# Patient Record
Sex: Male | Born: 1975 | Race: Black or African American | Hispanic: No | Marital: Single
Health system: Southern US, Community
[De-identification: ages and names within clinical notes are randomized; demographics above are authoritative.]

---

## 2006-08-16 ENCOUNTER — Emergency Department (HOSPITAL_COMMUNITY): Admission: EM | Admit: 2006-08-16 | Discharge: 2006-08-17 | Payer: Self-pay | Admitting: Emergency Medicine

## 2007-05-04 IMAGING — CR DG SHOULDER 2+V*R*
2 series · 2 of 2 positions shown · non-contrast
Comparison: none

CLINICAL DATA: Injury.
 RIGHT SHOULDER ? 2 VIEW:

[view not recorded (1 of 2)]
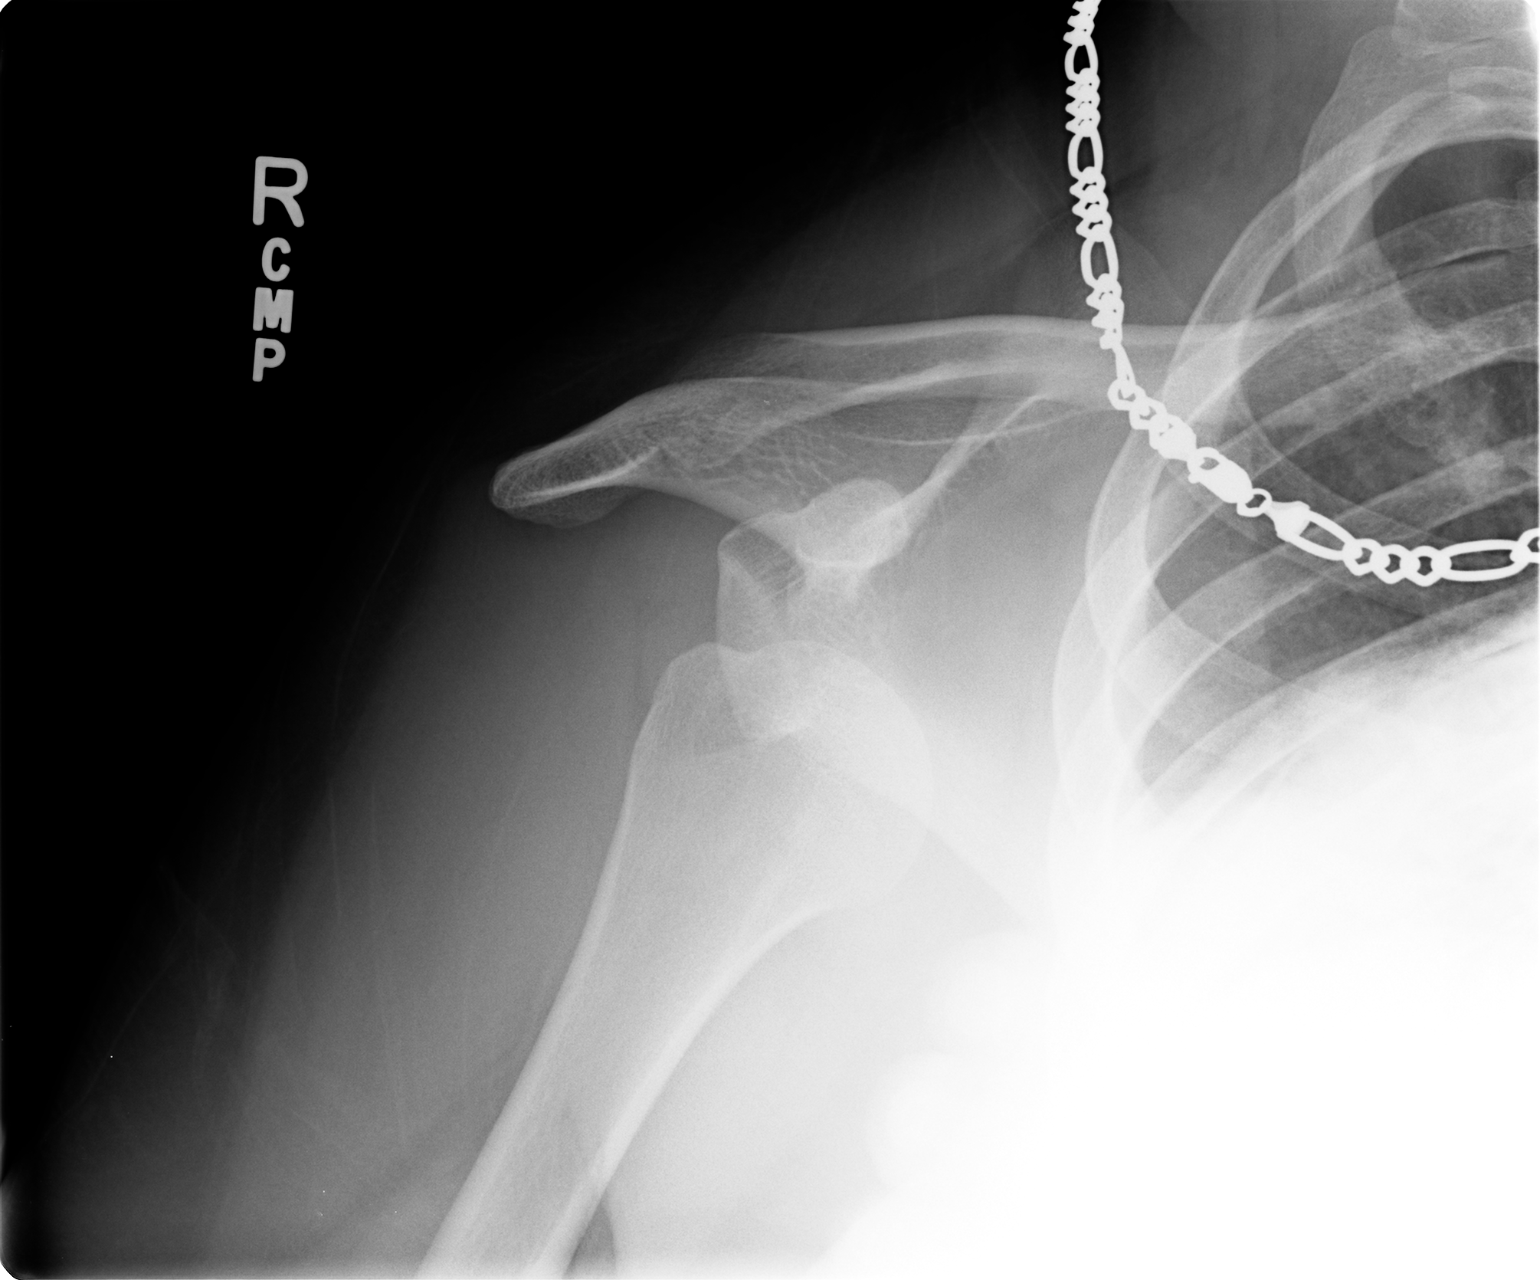

[view not recorded (2 of 2)]
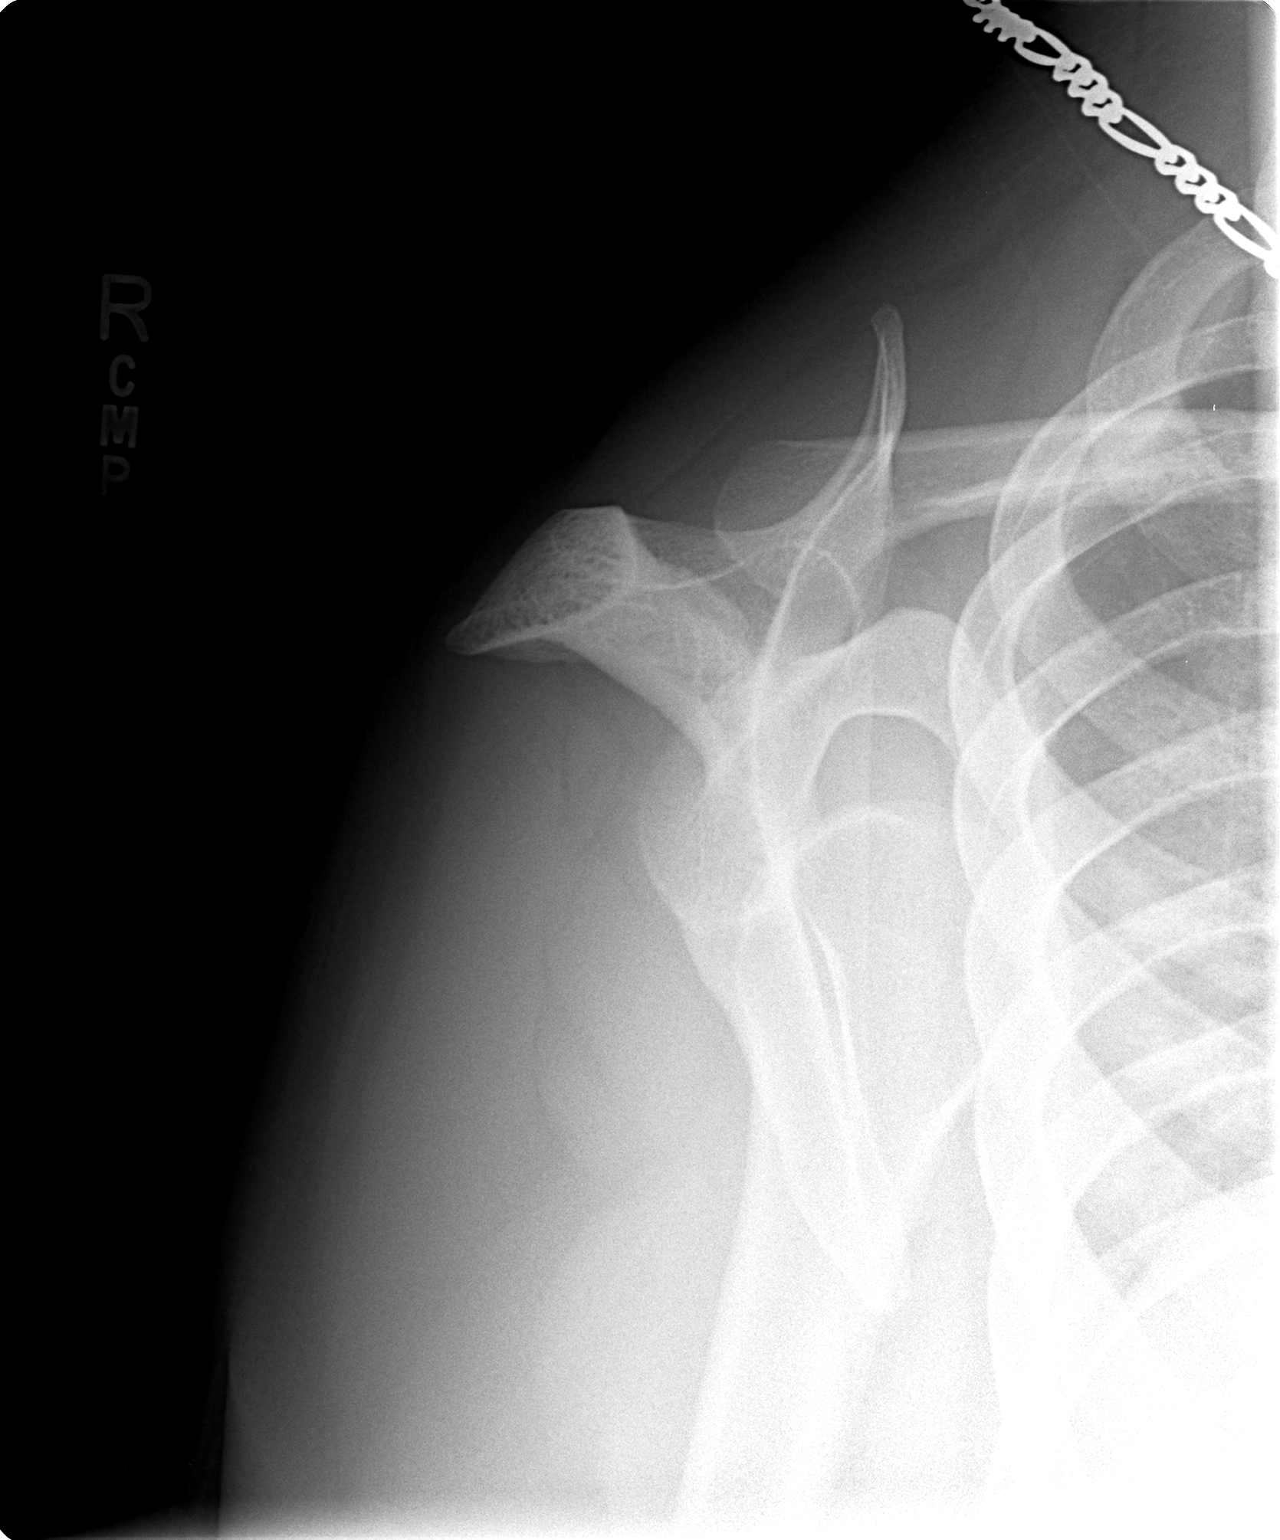

[2 of 2 positions shown; findings below may reference images not displayed]

FINDINGS: There is complete anterior dislocation of the humeral head with respect to the glenoid.  No underlying fracture is seen.
IMPRESSION: Complete anterior dislocation of the humeral head with respect to the glenoid.

## 2008-05-31 ENCOUNTER — Emergency Department (HOSPITAL_COMMUNITY): Admission: EM | Admit: 2008-05-31 | Discharge: 2008-05-31 | Payer: Self-pay | Admitting: Emergency Medicine

## 2011-02-24 NOTE — Consult Note (Signed)
NAME:  Randy Mueller, Randy Mueller               ACCOUNT NO.:  [id number]   MEDICAL RECORD NO.:  [id number]          PATIENT TYPE:  EMS   LOCATION:  [facility]                         FACILITY:  [facility]   PHYSICIAN:  Lindaann Slough, M.D.  DATE OF BIRTH:  [birth year]   DATE OF CONSULTATION:  05/31/2008  DATE OF DISCHARGE:  05/31/2008                                 CONSULTATION   REASON FOR CONSULTATION:  Swelling of left scrotum with purulent  drainage   HISTORY OF PRESENT ILLNESS:  The patient is a 35 year old male who was  seen in the emergency room this afternoon complaining of swelling of the  left scrotum associated with pain and purulent drainage.  He states that  he was bitten by a spider in the left thigh about a week ago and for the  past 2 days he has been having some swelling of the left scrotum  associated with pain, and the swelling has increased in size and then  this morning it started draining some purulent material.  Then he came  to the emergency room, and was found on physical examination to have a  scrotal abscess.  I was then asked to see the patient for further  evaluation.   PAST MEDICAL HISTORY:  Negative for diabetes and hypertension.   SOCIAL HISTORY:  He is a Paediatric nurse and a DJ, and he drinks occasionally.   ALLERGIES:  No known drug allergies.   FAMILY HISTORY:  Positive for diabetes.   MEDICATIONS:  None.   REVIEW OF SYSTEMS:  He has been complaining of low grade fever, and pain  and swelling on left scrotum, and everything else is negative.  He has  no voiding symptoms, no GI symptoms.   PHYSICAL EXAMINATION:  GENERAL:  This is a well-developed 35 year old  male who is complaining of left scrotal pain.  VITAL SIGNS:  His blood pressure is 116/60, pulse 64, respirations 18,  temperature 99.7.  HEENT:  His head is normal.  He has pink conjunctivae.  Ears, nose and  throat are within normal limits.  NECK:  Supple.  He has no cervical adenopathy.  No thyromegaly.  CHEST:   Symmetrical.  LUNGS:  Fully expanded and clear to percussion and auscultation.  HEART:  Regular rhythm.  ABDOMEN:  Soft and nontender.  He has no CVA tenderness.  Liver, spleen  and kidneys are not palpable.  Bladder is not distended.  He has no  inguinal adenopathy.  No inguinal hernia.  Bowel sounds are normal.  GENITALIA:  The penis is normal.  The meatus is normal.  The left  scrotum is swollen, firm, tender, with some purulent drainage from that  area.  Both testicles feel normal.  There is a superficial ulceration on  the left inner thigh that is granulating at this time.   LABORATORY DATA:  His hemoglobin is 13.6, hematocrit 39.8 and WBC 9.2,  sodium 139, potassium 4.0, BUN 5, creatinine 1.2, and glucose 77.   IMPRESSION:  Left scrotal abscess.   PLAN:  Incision and drainage of the abscess.  The procedure, the risks  and benefits were  discussed with the patient.  He understands and is  agreeable.   I infiltrated the scrotal skin with 2% lidocaine and incised the scrotal  abscess in its most fluctuant area.  About 10 mL of purulent material  were drained out.  Culture of the abscess was done.  Then I packed the  wound with iodoform gauze.   DISCHARGE MEDICATIONS:  The patient will then be discharged home on  Percocet 5/325, one or two tablets q.4 h. p.r.n. for pain, and  doxycycline 100 mg twice a day, and I will see him in the office  tomorrow for followup.      Lindaann Slough, M.D.  Electronically Signed     MN/MEDQ  D:  05/31/2008  T:  06/01/2008  Job:  [id number]

## 2019-04-25 ENCOUNTER — Other Ambulatory Visit: Payer: Self-pay | Admitting: *Deleted

## 2019-04-25 DIAGNOSIS — Z20822 Contact with and (suspected) exposure to covid-19: Secondary | ICD-10-CM

## 2019-04-30 LAB — NOVEL CORONAVIRUS, NAA: SARS-CoV-2, NAA: NOT DETECTED

## 2019-05-03 ENCOUNTER — Telehealth: Payer: Self-pay | Admitting: General Practice

## 2019-05-03 NOTE — Telephone Encounter (Signed)
Pt given covid -19 result(not detected) Pt verbalized understanding  °
